# Patient Record
Sex: Male | Born: 1970
Health system: Southern US, Community
[De-identification: ages and names within clinical notes are randomized; demographics above are authoritative.]

## PROBLEM LIST (undated history)

## (undated) DIAGNOSIS — K5792 Diverticulitis of intestine, part unspecified, without perforation or abscess without bleeding: Secondary | ICD-10-CM

## (undated) DIAGNOSIS — K760 Fatty (change of) liver, not elsewhere classified: Secondary | ICD-10-CM

## (undated) DIAGNOSIS — R748 Abnormal levels of other serum enzymes: Secondary | ICD-10-CM

## (undated) HISTORY — DX: Fatty (change of) liver, not elsewhere classified: K76.0

## (undated) HISTORY — PX: WISDOM TOOTH EXTRACTION: SHX21

## (undated) HISTORY — PX: ADENOIDECTOMY: SHX5191

## (undated) HISTORY — DX: Abnormal levels of other serum enzymes: R74.8

## (undated) HISTORY — DX: Diverticulitis of intestine, part unspecified, without perforation or abscess without bleeding: K57.92

---

## 2019-11-18 DIAGNOSIS — R1013 Epigastric pain: Secondary | ICD-10-CM | POA: Diagnosis not present

## 2019-11-19 DIAGNOSIS — R3129 Other microscopic hematuria: Secondary | ICD-10-CM | POA: Diagnosis not present

## 2019-11-19 DIAGNOSIS — Z125 Encounter for screening for malignant neoplasm of prostate: Secondary | ICD-10-CM | POA: Diagnosis not present

## 2019-11-19 DIAGNOSIS — K921 Melena: Secondary | ICD-10-CM | POA: Diagnosis not present

## 2019-11-19 DIAGNOSIS — R1032 Left lower quadrant pain: Secondary | ICD-10-CM | POA: Diagnosis not present

## 2019-11-19 DIAGNOSIS — Z1211 Encounter for screening for malignant neoplasm of colon: Secondary | ICD-10-CM | POA: Diagnosis not present

## 2019-11-29 NOTE — Progress Notes (Addendum)
11/29/2019 Carlos Adams [id number] June 24, 1971  Chief Complaint: Rectal bleeding. Abdominal pain.  HISTORY OF PRESENT ILLNESS: Carlos Adams is a 49 year old male without a significant past medical history. No surgical history. He was referred to our office by Lindaann Pascal PA-C for further evaluation for rectal bleeding. He saw bright red blood on the toilet tissue, on the stool and in the toilet water approximately 3 weeks ago. No associated rectal pain. No constipation or straining. He has seen blood on the tissue sporadically over the past year which he attributed to possibly having hemorrhoids but the episode 3 weeks ago was a larger amount of blood. Two weeks ago,  he developed LLQ abdominal pain which worsened over the next 24 hours. He went to the urgent care and he was prescribed Cipro and Flagyl 500mg  po bid x 10 days for presumed diverticulitis. An abdominal/pelvic CT was ordered but delayed due to insurance authorization. He is scheduled to have the abd/pelvic CT later this week. Labs reported as normal. His LLQ abdominal pain significantly improved 3 to 4 days after starting the antibiotics. He finished his last doses of Cipro and Flagyl today. He denies having any further abdominal pain. No further rectal bleeding. Infrequent NSAID use. He drinks 3 to 4 beers daily ( he did not drink any alcohol while taking Cipro and Flagyl). He smokes marijuana 4 to 5 times monthly. Paternal grandmother with history of colon cancer, he is not sure at what age she was diagnosed. Father with history of diverticulitis.   Current Outpatient Medications on File Prior to Visit  Medication Sig Dispense Refill  . ciprofloxacin (CIPRO) 500 MG tablet Take 500 mg by mouth 2 (two) times daily.     No current facility-administered medications on file prior to visit.   No Known Allergies  REVIEW OF SYSTEMS: Gen: Denies fever, sweats or chills. No weight loss.  CV: Denies chest pain, palpitations or edema. Resp: Denies  cough, shortness of breath of hemoptysis.  GI: See HPI. No GERD sx. GU : Denies urinary burning, blood in urine, increased urinary frequency or incontinence. MS: Denies joint pain, muscles aches or weakness. Derm: Denies rash, itchiness, skin lesions or unhealing ulcers. Psych: Denies depression, anxiety, memory loss, suicidal ideation and confusion. Heme: Denies bruising, bleeding. Neuro:  Denies headaches, dizziness or paresthesias. Endo:  Denies any problems with DM, thyroid or adrenal function.   PHYSICAL EXAM: BP 120/74   Pulse 84   Temp (!) 95.8 F (35.4 C)   Ht 5\' 10"  (1.778 m)   Wt 170 lb (77.1 kg)   BMI 24.39 kg/m  General: Well developed 49 year old male in no acute distress. Head: Normocephalic and atraumatic. Eyes:  Sclerae non-icteric, conjunctive pink. Ears: Normal auditory acuity. Neck: Supple, no lymphadenopathy or thyromegaly.  Lungs: Clear bilaterally to auscultation without wheezes, crackles or rhonchi. Heart: Regular rate and rhythm. No murmur, rub or gallop appreciated.  Abdomen: Soft, nontender, non distended. No masses. No hepatosplenomegaly. Normoactive bowel sounds x 4 quadrants.  Rectal: Deferred, patient to proceed with a colonoscopy for further evaluation. Musculoskeletal: Symmetrical with no gross deformities. Skin: Warm and dry. No rash or lesions on visible extremities. Extremities: No edema. Neurological: Alert oriented x 4, no focal deficits.  Psychological:  Alert and cooperative. Normal mood and affect.  ASSESSMENT AND PLAN:  7. 49 year old male with LLQ pain, presumed to have diverticulitis which resolved after taking Cipro and Flagyl x 10 days. -Colonoscopy benefits and risks  discussed including risk with sedation, risk of bleeding, perforation and infection -Patient to call our office if his rectal bleeding or lower abdominal pain recurs -Request copy of labs from urgent care   2. Family history of colon cancer -See plan # 1  3.  Alcohol over use -Decrease alcohol intake advised    ADDENDUM: Patient developed LLQ abdominal pain. I received a copy of his abd/pelvic CT dated 12/08/2019 which  identified sigmoid diverticulitis with small focus of extraluminal gas image 122 compatible with contained perforation. No abscess. No evidence of bowel obstruction or appendicitis. He was treated with antibiotics for diverticulitis prior to completing the CT. He completed a course of Cipro and Flagyl. His colonoscopy date was rescheduled for 6 weeks post diverticulitis with contained perforation as discussed with Dr. Hilarie Fredrickson. Patient was contacted, no further abdominal pain but had some lower back discomfort. Patient was previously advised to call our office if his lower abdominal pain recurs.    CC:  Long, [place], PA-C

## 2019-12-01 ENCOUNTER — Ambulatory Visit (INDEPENDENT_AMBULATORY_CARE_PROVIDER_SITE_OTHER): Payer: BC Managed Care – PPO | Admitting: Nurse Practitioner

## 2019-12-01 ENCOUNTER — Encounter: Payer: Self-pay | Admitting: Nurse Practitioner

## 2019-12-01 ENCOUNTER — Other Ambulatory Visit: Payer: Self-pay

## 2019-12-01 VITALS — BP 120/74 | HR 84 | Temp 95.8°F | Ht 70.0 in | Wt 170.0 lb

## 2019-12-01 DIAGNOSIS — R1032 Left lower quadrant pain: Secondary | ICD-10-CM | POA: Diagnosis not present

## 2019-12-01 DIAGNOSIS — Z8371 Family history of colonic polyps: Secondary | ICD-10-CM | POA: Diagnosis not present

## 2019-12-01 DIAGNOSIS — K5732 Diverticulitis of large intestine without perforation or abscess without bleeding: Secondary | ICD-10-CM | POA: Diagnosis not present

## 2019-12-01 DIAGNOSIS — K625 Hemorrhage of anus and rectum: Secondary | ICD-10-CM | POA: Diagnosis not present

## 2019-12-01 DIAGNOSIS — Z01818 Encounter for other preprocedural examination: Secondary | ICD-10-CM

## 2019-12-01 MED ORDER — NA SULFATE-K SULFATE-MG SULF 17.5-3.13-1.6 GM/177ML PO SOLN: 1.0000 | Freq: Once | ORAL | 0 refills | Status: AC

## 2019-12-01 NOTE — Patient Instructions (Addendum)
If you are age 51 or older, your body mass index should be between 23-30. Your Body mass index is 24.39 kg/m. If this is out of the aforementioned range listed, please consider follow up with your Primary Care Provider.  If you are age 74 or younger, your body mass index should be between 19-25. Your Body mass index is 24.39 kg/m. If this is out of the aformentioned range listed, please consider follow up with your Primary Care Provider.   You have been scheduled for a colonoscopy. Please follow written instructions given to you at your visit today.  Please pick up your prep supplies at the pharmacy within the next 1-3 days. If you use inhalers (even only as needed), please bring them with you on the day of your procedure.  Due to recent changes in healthcare laws, you Markowicz see the results of your imaging and laboratory studies on MyChart before your provider has had a chance to review them.  We understand that in some cases there Orsino be results that are confusing or concerning to you. Not all laboratory results come back in the same time frame and the provider Chipman be waiting for multiple results in order to interpret others.  Please give [place] 48 hours in order for your provider to thoroughly review all the results before contacting the office for clarification of your results.   Thank you for choosing [facility] Arnaldo Natal, CRNP

## 2019-12-02 NOTE — Progress Notes (Signed)
Addendum: Reviewed and agree with assessment and management plan. Daviel Allegretto M, MD  

## 2019-12-08 DIAGNOSIS — K5720 Diverticulitis of large intestine with perforation and abscess without bleeding: Secondary | ICD-10-CM | POA: Diagnosis not present

## 2020-01-01 ENCOUNTER — Telehealth: Payer: Self-pay | Admitting: Nurse Practitioner

## 2020-01-01 NOTE — Telephone Encounter (Signed)
Bre, please call the patient and obtain a symptom update, verify if he is still having any abdominal pain after he was treated with antibiotic for diverticulitis with a contained perforation. Please inform the patient that due to his recent episode of diverticulitis with a contained perforation per CT 12/08/2019, his colonoscopy will need to be rescheduled for 6 weeks after his CT date as verified by Dr. Rhea Belton. So please reschedule his colonoscopy for after 01/19/2020. Pls let me know symptom update. Thx.

## 2020-01-01 NOTE — Telephone Encounter (Signed)
Please review previous message and advise 

## 2020-01-04 ENCOUNTER — Telehealth: Payer: Self-pay | Admitting: Nurse Practitioner

## 2020-01-04 NOTE — Telephone Encounter (Signed)
See alternative telephone note

## 2020-01-04 NOTE — Telephone Encounter (Signed)
Called and spoke with patient-patient reports he is feeling a little better=LLQ pain has resolved; denies nausea/vomiting/diarrhea/fever/rectal bleeding or pain;  -Is still having back pain/soreness (2/10)-"I am still able to go out and play golf";    Patient had the CT scan on 12/08/2019; patient has been scheduled for his colon on 01/26/2020 at 10:00 am; COVID screening on 01/22/2020 at 9:00 am; instructions have been sent to the patient via MyChart per his request;

## 2020-01-06 NOTE — Telephone Encounter (Signed)
I called the patient just to clarify his back pain.  He stated he golfs a lot and he typically has some lower back soreness.  His left lower quadrant abdominal pain associated with his diverticulitis episode has completely resolved.  I reinforced the importance of avoiding constipation to prevent further diverticulitis episodes.  Drink plenty of water as well.  He agreed to call our office if his lower abdominal pain recurs prior to his colonoscopy date.

## 2020-01-12 ENCOUNTER — Encounter: Payer: BC Managed Care – PPO | Admitting: Internal Medicine

## 2020-01-13 ENCOUNTER — Encounter: Payer: BC Managed Care – PPO | Admitting: Gastroenterology

## 2020-01-19 ENCOUNTER — Telehealth: Payer: Self-pay | Admitting: Nurse Practitioner

## 2020-01-19 DIAGNOSIS — K5732 Diverticulitis of large intestine without perforation or abscess without bleeding: Secondary | ICD-10-CM

## 2020-01-19 MED ORDER — CIPROFLOXACIN HCL 500 MG PO TABS: 500.0000 mg | ORAL_TABLET | Freq: Two times a day (BID) | ORAL | 0 refills | Status: AC

## 2020-01-19 MED ORDER — METRONIDAZOLE 500 MG PO TABS: 500.0000 mg | ORAL_TABLET | Freq: Two times a day (BID) | ORAL | 0 refills | Status: AC

## 2020-01-19 NOTE — Telephone Encounter (Signed)
Called and spoke with patient-pharmacy verified by patient-patient informed of provider's recommendations and is agreeable to plan of care; RX has been sent; patient has been scheduled for a f/u OV on 01/21/2020 at 1:50 pm;  Patient advised to call back to the office at [phone] should questions/concerns arise;  Patient verbalized understanding of information/instructions;

## 2020-01-19 NOTE — Telephone Encounter (Signed)
Bri, pls call patient, he had diverticulitis with a contained perforation 1/20201. He needs office visit tomorrow for further evaluation. Pls send in RX for cipro 500mg  1 po bid and Flagyl 500mg  1 po bid x 10 days, pt to start today. If he has severe pain then to ER. diet, low fiber diet today and push fluids. He is scheduled for a colonoscopy 3/23 which Rathod need to be rescheduled, to determined further after he is seen in office

## 2020-01-19 NOTE — Telephone Encounter (Signed)
Pt believes that he is having a diverticulitis flare.  Please advise.

## 2020-01-19 NOTE — Telephone Encounter (Signed)
Noted  

## 2020-01-19 NOTE — Telephone Encounter (Signed)
Called and spoke with patient- patient reports he started having a tender/pain in the LLQ abd this morning-pain on palpatation (2/10)-no nausea/constipation/diarrhea/fever/rectal bleeding/rectal pain-no problems with po intake;-patient reports this Dubree be diet related-has not been following a "sensible diet" -took Miralax this morning just to see if that might help with abd tenderness Please advise on next step in care

## 2020-01-21 ENCOUNTER — Other Ambulatory Visit (INDEPENDENT_AMBULATORY_CARE_PROVIDER_SITE_OTHER): Payer: BC Managed Care – PPO

## 2020-01-21 ENCOUNTER — Other Ambulatory Visit: Payer: Self-pay

## 2020-01-21 ENCOUNTER — Ambulatory Visit (INDEPENDENT_AMBULATORY_CARE_PROVIDER_SITE_OTHER): Payer: BC Managed Care – PPO | Admitting: Nurse Practitioner

## 2020-01-21 ENCOUNTER — Encounter: Payer: Self-pay | Admitting: Nurse Practitioner

## 2020-01-21 VITALS — BP 140/76 | HR 80 | Temp 98.3°F | Ht 70.0 in | Wt 171.0 lb

## 2020-01-21 DIAGNOSIS — K5720 Diverticulitis of large intestine with perforation and abscess without bleeding: Secondary | ICD-10-CM

## 2020-01-21 DIAGNOSIS — K5732 Diverticulitis of large intestine without perforation or abscess without bleeding: Secondary | ICD-10-CM | POA: Diagnosis not present

## 2020-01-21 DIAGNOSIS — R1032 Left lower quadrant pain: Secondary | ICD-10-CM

## 2020-01-21 LAB — CBC WITH DIFFERENTIAL/PLATELET
Basophils Absolute: 0.1 10*3/uL (ref 0.0–0.1)
Basophils Relative: 0.9 % (ref 0.0–3.0)
Eosinophils Absolute: 0.2 10*3/uL (ref 0.0–0.7)
Eosinophils Relative: 4.1 % (ref 0.0–5.0)
HCT: 42.4 % (ref 39.0–52.0)
Hemoglobin: 14.8 g/dL (ref 13.0–17.0)
Lymphocytes Relative: 13.1 % (ref 12.0–46.0)
Lymphs Abs: 0.7 10*3/uL (ref 0.7–4.0)
MCHC: 34.8 g/dL (ref 30.0–36.0)
MCV: 98.8 fl (ref 78.0–100.0)
Monocytes Absolute: 0.7 10*3/uL (ref 0.1–1.0)
Monocytes Relative: 12.6 % — ABNORMAL HIGH (ref 3.0–12.0)
Neutro Abs: 3.8 10*3/uL (ref 1.4–7.7)
Neutrophils Relative %: 69.3 % (ref 43.0–77.0)
Platelets: 265 10*3/uL (ref 150.0–400.0)
RBC: 4.29 Mil/uL (ref 4.22–5.81)
RDW: 12.8 % (ref 11.5–15.5)
WBC: 5.5 10*3/uL (ref 4.0–10.5)

## 2020-01-21 LAB — COMPREHENSIVE METABOLIC PANEL
ALT: 39 U/L (ref 0–53)
AST: 55 U/L — ABNORMAL HIGH (ref 0–37)
Albumin: 4.5 g/dL (ref 3.5–5.2)
Alkaline Phosphatase: 69 U/L (ref 39–117)
BUN: 13 mg/dL (ref 6–23)
CO2: 28 mEq/L (ref 19–32)
Calcium: 10.2 mg/dL (ref 8.4–10.5)
Chloride: 98 mEq/L (ref 96–112)
Creatinine, Ser: 1.01 mg/dL (ref 0.40–1.50)
GFR: 77.98 mL/min (ref >60.00)
Glucose, Bld: 124 mg/dL — ABNORMAL HIGH (ref 70–99)
Potassium: 3.8 mEq/L (ref 3.5–5.1)
Sodium: 134 mEq/L — ABNORMAL LOW (ref 135–145)
Total Bilirubin: 0.5 mg/dL (ref 0.2–1.2)
Total Protein: 7.7 g/dL (ref 6.0–8.3)

## 2020-01-21 LAB — C-REACTIVE PROTEIN: CRP: 2 mg/dL (ref 0.5–20.0)

## 2020-01-21 NOTE — Progress Notes (Signed)
01/21/2020 Carlos Adams [id number] 1971/08/08   Chief Complaint: Diverticulitis   History of Present Illness: Carlos Adams is a 49 year old male without a significant past medical history. No surgical history. I saw him in the office on 12/01/2019 after he was treated with Cipro and Flagyl 500mg  po bid x 10 days for diverticulitis by his PCP Carlos Long PA-C . An abdominal/pelvic CT was delayed due to insurance issues but was done on  12/08/2019 which identified sigmoid diverticulitis with a small contained perforation without evidence of an abscess. His LLQ abdominal pain abated without requiring further antibiotics. A colonoscopy was rescheduled for 01/26/2020. He contacted our office on 01/19/2020 with complaints of mild LLQ abdominal pain which started upon awakening on 01/18/2020.  He denied any associated constipation.  He blames his dietary habits for his diverticulitis.  He eats fast foods and a low fiber diet on a regular basis.  He was started on Cipro and Flagyl 500mg  po bid and he was advised to follow up in the office for further evaluation as his colonoscopy [place] need to be delayed. He presents today for further evaluation.  His left lower quadrant abdominal pain has decreased since starting the Cipro and Flagyl 2 days ago.  No fever.  He is passing normal brown bowel movement daily.    Abdominal/pelvic CT with contrast 12/08/2019 [facility]: CT ABDOMEN: Lung bases clear and heart size normal. Low-attenuation liver suggesting fatty infiltration. Normal gallbladder, biliary tree, pancreas, spleen, adrenal glands, and kidneys. Aortoiliac atherosclerosis without aneurysm. No ascites or adenopathy.  CT PELVIS: Normal bladder, prostate, and seminal vesicles. Sigmoid diverticulitis image 129 with small focus of extraluminal gas image 122 compatible with contained perforation. No abscess. No evidence for bowel obstruction or appendicitis. DDD L2-L3.  IMPRESSION: 1. Perforated sigmoid diverticulitis.  No abscess. 2. Fatty liver. 3. Aortoiliac atherosclerosis without aneurysm.   Current Medications, Allergies, Past Medical History, Past Surgical History, Family History and Social History were reviewed in [employer] record.   Physical Exam: BP 140/76   Pulse 80   Temp 98.3 F (36.8 C)   Ht 5\' 10"  (1.778 m)   Wt 171 lb (77.6 kg)   BMI 24.54 kg/m   General: Well developed male in no acute distress. Head: Normocephalic and atraumatic. Eyes:  No scleral icterus. Conjunctiva pink . Ears: Normal auditory acuity. Lungs: Clear throughout to auscultation. Heart: Regular rate and rhythm, no murmur. Abdomen: Soft, thickened area central lower abdomen left of midline with associated tenderness.  No rebound or guarding.  No masses or hepatomegaly. Normal bowel sounds x 4 quadrants.  Rectal: Deferred.  Musculoskeletal: Symmetrical with no gross deformities. Extremities: No edema. Neurological: Alert oriented x 4. No focal deficits.  Psychological:  Alert and cooperative. Normal mood and affect  Assessment and Recommendations:  1. Carlos Adams is a 49 year old male diagnosed with sigmoid diverticulitis 11/2019 treated with Cipro and Flagyl 500 mg twice daily for 10 days.  An abdominal/pelvic CT was delayed due to insurance issues and was done on 12/08/2019 which identified  sigmoid diverticulitis with a small contained perforation.  -Cancel colonoscopy scheduled 01/26/2020.  Reschedule colonoscopy after repeat abd/pelvic CT results reviewed.  -Continue Cipro and Flagyl 500mg  po bid x 10 days  -CBC, CMP and CRP -Repeat abd/pelvic CT with oral and IV  Contrast -Patient to call our office if his abdominal pain persists or worsens.  To the ED if he develops severe abdominal pain. -Low fiber diet  for 24 hours then advance diet -Miralax as needed -Phillip's bacteria probiotic once daily  -Discussed surgical consult after colonoscopy completed   2. Fatty liver  -CMP -follow  up after colonoscopy completed

## 2020-01-21 NOTE — Patient Instructions (Signed)
If you are age 51 or older, your body mass index should be between 23-30. Your Body mass index is 24.54 kg/m. If this is out of the aforementioned range listed, please consider follow up with your Primary Care Provider.  If you are age 74 or younger, your body mass index should be between 19-25. Your Body mass index is 24.54 kg/m. If this is out of the aformentioned range listed, please consider follow up with your Primary Care Provider.   You have been scheduled for a CT scan of the abdomen and pelvis at [facility] ([address]---this is in the same building as [facility]).   You are scheduled on 01/28/2020 at 10:00am. You should arrive 15 minutes prior to your appointment time for registration. Please follow the written instructions below on the day of your exam:  WARNING: IF YOU ARE ALLERGIC TO IODINE/X-RAY DYE, PLEASE NOTIFY RADIOLOGY IMMEDIATELY AT [phone]! YOU WILL BE GIVEN A 13 HOUR PREMEDICATION PREP.  1) Do not eat or drink anything after 6:00 am (4 hours prior to your test) 2) You have been given 2 bottles of oral contrast to drink. The solution Dosch taste better if refrigerated, but do NOT add ice or any other liquid to this solution. Shake well before drinking.    Drink 1 bottle of contrast @ 8:00 am (2 hours prior to your exam)  Drink 1 bottle of contrast @ 9:00 am (1 hour prior to your exam)  You Defilippo take any medications as prescribed with a small amount of water, if necessary. If you take any of the following medications: METFORMIN, GLUCOPHAGE, GLUCOVANCE, AVANDAMET, RIOMET, FORTAMET, [place] MET, JANUMET, GLUMETZA or METAGLIP, you Nunziata be asked to HOLD this medication 48 hours AFTER the exam.  The purpose of you drinking the oral contrast is to aid in the visualization of your intestinal tract. The contrast solution Weiss cause some diarrhea. Depending on your individual set of symptoms, you Schnake also receive an intravenous injection of x-ray  contrast/dye. Plan on being at [facility] for 30 minutes or longer, depending on the type of exam you are having performed.  This test typically takes 30-45 minutes to complete.  If you have any questions regarding your exam or if you need to reschedule, you Decamp call the CT department at [phone] between the hours of 8:00 am and 5:00 pm, Monday-Friday.  ________________________________________________________________________  Your provider has requested that you go to the basement level for lab work before leaving today. Press "B" on the elevator. The lab is located at the first door on the left as you exit the elevator.  Continue your Cipro and Flagyl Use Phillips bacteria 1 tablet daily for 2 weeks.  Go to the ED if your symptoms worsen.  Due to recent changes in healthcare laws, you Ziomek see the results of your imaging and laboratory studies on MyChart before your provider has had a chance to review them.  We understand that in some cases there Mell be results that are confusing or concerning to you. Not all laboratory results come back in the same time frame and the provider Jillson be waiting for multiple results in order to interpret others.  Please give [place] 48 hours in order for your provider to thoroughly review all the results before contacting the office for clarification of your results.

## 2020-01-23 NOTE — Progress Notes (Signed)
Addendum: Reviewed and agree with assessment and management plan. Eino Whitner M, MD  

## 2020-01-25 ENCOUNTER — Other Ambulatory Visit: Payer: Self-pay

## 2020-01-25 DIAGNOSIS — R7401 Elevation of levels of liver transaminase levels: Secondary | ICD-10-CM

## 2020-01-26 ENCOUNTER — Encounter: Payer: BC Managed Care – PPO | Admitting: Gastroenterology

## 2020-01-28 ENCOUNTER — Ambulatory Visit (INDEPENDENT_AMBULATORY_CARE_PROVIDER_SITE_OTHER)
Admission: RE | Admit: 2020-01-28 | Discharge: 2020-01-28 | Disposition: A | Discharge status: Home / Self Care | Payer: BC Managed Care – PPO | Source: Ambulatory Visit | Attending: Nurse Practitioner | Admitting: Nurse Practitioner

## 2020-01-28 ENCOUNTER — Other Ambulatory Visit: Payer: Self-pay

## 2020-01-28 DIAGNOSIS — K5732 Diverticulitis of large intestine without perforation or abscess without bleeding: Secondary | ICD-10-CM

## 2020-01-28 DIAGNOSIS — R1032 Left lower quadrant pain: Secondary | ICD-10-CM | POA: Diagnosis not present

## 2020-01-28 DIAGNOSIS — R109 Unspecified abdominal pain: Secondary | ICD-10-CM | POA: Diagnosis not present

## 2020-01-28 MED ORDER — IOHEXOL 300 MG/ML  SOLN
100.0000 mL | Freq: Once | INTRAMUSCULAR | Status: AC | PRN
  Administered 2020-01-28: 100 mL via INTRAVENOUS

## 2020-02-04 ENCOUNTER — Other Ambulatory Visit (INDEPENDENT_AMBULATORY_CARE_PROVIDER_SITE_OTHER): Payer: BC Managed Care – PPO

## 2020-02-04 DIAGNOSIS — R7401 Elevation of levels of liver transaminase levels: Secondary | ICD-10-CM | POA: Diagnosis not present

## 2020-02-04 LAB — HEPATIC FUNCTION PANEL
ALT: 40 U/L (ref 0–53)
AST: 29 U/L (ref 0–37)
Albumin: 4.8 g/dL (ref 3.5–5.2)
Alkaline Phosphatase: 55 U/L (ref 39–117)
Bilirubin, Direct: 0.1 mg/dL (ref 0.0–0.3)
Total Bilirubin: 0.7 mg/dL (ref 0.2–1.2)
Total Protein: 7.6 g/dL (ref 6.0–8.3)

## 2020-02-15 ENCOUNTER — Other Ambulatory Visit: Payer: Self-pay

## 2020-02-15 ENCOUNTER — Ambulatory Visit (AMBULATORY_SURGERY_CENTER): Payer: Self-pay | Admitting: *Deleted

## 2020-02-15 VITALS — Temp 98.1°F | Ht 70.0 in | Wt 167.6 lb

## 2020-02-15 DIAGNOSIS — Z1211 Encounter for screening for malignant neoplasm of colon: Secondary | ICD-10-CM

## 2020-02-15 DIAGNOSIS — K5732 Diverticulitis of large intestine without perforation or abscess without bleeding: Secondary | ICD-10-CM

## 2020-02-15 NOTE — Progress Notes (Signed)
Patient denies any allergies to egg or soy products. Patient denies complications with anesthesia/sedation.  Patient denies oxygen use at home and denies diet medications. Emmi instructions for colonoscopy explained and given to patient.  

## 2020-02-22 ENCOUNTER — Ambulatory Visit (INDEPENDENT_AMBULATORY_CARE_PROVIDER_SITE_OTHER): Payer: BC Managed Care – PPO

## 2020-02-22 ENCOUNTER — Other Ambulatory Visit: Payer: Self-pay | Admitting: Gastroenterology

## 2020-02-22 DIAGNOSIS — Z1159 Encounter for screening for other viral diseases: Secondary | ICD-10-CM | POA: Diagnosis not present

## 2020-02-22 LAB — SARS CORONAVIRUS 2 (TAT 6-24 HRS): SARS Coronavirus 2: NEGATIVE

## 2020-02-23 ENCOUNTER — Encounter: Payer: Self-pay | Admitting: Gastroenterology

## 2020-02-24 ENCOUNTER — Other Ambulatory Visit: Payer: Self-pay

## 2020-02-24 ENCOUNTER — Encounter: Payer: Self-pay | Admitting: Gastroenterology

## 2020-02-24 ENCOUNTER — Ambulatory Visit (AMBULATORY_SURGERY_CENTER): Payer: BC Managed Care – PPO | Admitting: Gastroenterology

## 2020-02-24 VITALS — BP 123/75 | HR 60 | Temp 96.8°F | Resp 15 | Ht 70.0 in | Wt 167.0 lb

## 2020-02-24 DIAGNOSIS — Z1211 Encounter for screening for malignant neoplasm of colon: Secondary | ICD-10-CM

## 2020-02-24 MED ORDER — SODIUM CHLORIDE 0.9 % IV SOLN: 500.0000 mL | Freq: Once | INTRAVENOUS | Status: DC

## 2020-02-24 NOTE — Progress Notes (Signed)
Pt's states no medical or surgical changes since previsit or office visit.  LC - temp DT - vitals 

## 2020-02-24 NOTE — Patient Instructions (Signed)
YOU HAD AN ENDOSCOPIC PROCEDURE TODAY AT THE [facility]:   Refer to the procedure report that was given to you for any specific questions about what was found during the examination.  If the procedure report does not answer your questions, please call your gastroenterologist to clarify.  If you requested that your care partner not be given the details of your procedure findings, then the procedure report has been included in a sealed envelope for you to review at your convenience later. ? ?**Handout given on Diverticulosis** ? ?YOU SHOULD EXPECT: Some feelings of bloating in the abdomen. Passage of more gas than usual.  Walking can help get rid of the air that was put into your GI tract during the procedure and reduce the bloating. If you had a lower endoscopy (such as a colonoscopy or flexible sigmoidoscopy) you Fristoe notice spotting of blood in your stool or on the toilet paper. If you underwent a bowel prep for your procedure, you Depp not have a normal bowel movement for a few days. ? ?Please Note:  You might notice some irritation and congestion in your nose or some drainage.  This is from the oxygen used during your procedure.  There is no need for concern and it should clear up in a day or so. ? ?SYMPTOMS TO REPORT IMMEDIATELY: ? ?Following lower endoscopy (colonoscopy or flexible sigmoidoscopy): ? Excessive amounts of blood in the stool ? Significant tenderness or worsening of abdominal pains ? Swelling of the abdomen that is new, acute ? Fever of 100?F or higher ? ? ?For urgent or emergent issues, a gastroenterologist can be reached at any hour by calling [phone]. ?Do not use MyChart messaging for urgent concerns.  ? ? ?DIET:  We do recommend a small meal at first, but then you Laverdiere proceed to your regular diet.  Drink plenty of fluids but you should avoid alcoholic beverages for 24 hours. ? ?ACTIVITY:  You should plan to take it easy for the rest of today and you should NOT DRIVE or use  heavy machinery until tomorrow (because of the sedation medicines used during the test).   ? ?FOLLOW UP: ?Our staff will call the number listed on your records 48-72 hours following your procedure to check on you and address any questions or concerns that you Frady have regarding the information given to you following your procedure. If we do not reach you, we will leave a message.  We will attempt to reach you two times.  During this call, we will ask if you have developed any symptoms of COVID 19. If you develop any symptoms (ie: fever, flu-like symptoms, shortness of breath, cough etc.) before then, please call [phone].  If you test positive for Covid 19 in the 2 weeks post procedure, please call and report this information to us.   ? ?If any biopsies were taken you will be contacted by phone or by letter within the next 1-3 weeks.  Please call us at [phone] if you have not heard about the biopsies in 3 weeks.  ? ? ?SIGNATURES/CONFIDENTIALITY: ?You and/or your care partner have signed paperwork which will be entered into your electronic medical record.  These signatures attest to the fact that that the information above on your After Visit Summary has been reviewed and is understood.  Full responsibility of the confidentiality of this discharge information lies with you and/or your care-partner.  ?

## 2020-02-24 NOTE — Progress Notes (Signed)
A and O x3. Report to RN. Tolerated MAC anesthesia well.

## 2020-02-24 NOTE — Op Note (Signed)
[place] Patient Name: Carlos Adams Procedure Date: 02/24/2020 10:05 AM MRN: [id number] Endoscopist: Mallie Mussel L. Loletha Carrow , MD Age: 49 Referring MD:  Date of Birth: 1971-10-13 Gender: Male Account #: [id number] Procedure:                Colonoscopy Indications:              Screening for colorectal malignant neoplasm, This                            is the patient's first colonoscopy                           (Patient has recently had diverticulitis) Medicines:                Monitored Anesthesia Care Procedure:                Pre-Anesthesia Assessment:                           - Prior to the procedure, a History and Physical                            was performed, and patient medications and                            allergies were reviewed. The patient's tolerance of                            previous anesthesia was also reviewed. The risks                            and benefits of the procedure and the sedation                            options and risks were discussed with the patient.                            All questions were answered, and informed consent                            was obtained. Prior Anticoagulants: The patient has                            taken no previous anticoagulant or antiplatelet                            agents. ASA Grade Assessment: II - A patient with                            mild systemic disease. After reviewing the risks                            and benefits, the patient was deemed in  satisfactory condition to undergo the procedure.                           After obtaining informed consent, the colonoscope                            was passed under direct vision. Throughout the                            procedure, the patient's blood pressure, pulse, and                            oxygen saturations were monitored continuously. The                            Colonoscope was introduced through the  anus and                            advanced to the the cecum, identified by                            appendiceal orifice and ileocecal valve. The                            colonoscopy was performed without difficulty. The                            patient tolerated the procedure well. The quality                            of the bowel preparation was excellent. The                            ileocecal valve, appendiceal orifice, and rectum                            were photographed. The bowel preparation used was                            Miralax. Scope In: 10:09:40 AM Scope Out: 10:24:57 AM Scope Withdrawal Time: 0 hours 14 minutes 3 seconds  Total Procedure Duration: 0 hours 15 minutes 17 seconds  Findings:                 The perianal and digital rectal examinations were                            normal.                           Multiple small-mouthed diverticula were found in                            the left colon.  The exam was otherwise without abnormality on                            direct and retroflexion views. Complications:            No immediate complications. Estimated Blood Loss:     Estimated blood loss: none. Impression:               - Diverticulosis in the left colon.                           - The examination was otherwise normal on direct                            and retroflexion views.                           - No specimens collected. Recommendation:           - Patient has a contact number available for                            emergencies. The signs and symptoms of potential                            delayed complications were discussed with the                            patient. Return to normal activities tomorrow.                            Written discharge instructions were provided to the                            patient.                           - Resume previous diet.                           -  Continue present medications.                           - Repeat colonoscopy in 10 years for screening                            purposes. Cleopatra Sardo L. Myrtie Neither, MD 02/24/2020 10:27:32 AM This report has been signed electronically.

## 2020-02-26 ENCOUNTER — Telehealth: Payer: Self-pay | Admitting: *Deleted

## 2020-02-26 NOTE — Telephone Encounter (Signed)
  Follow up Call-  Call back number 02/24/2020  Post procedure Call Back phone  # [phone]  Permission to leave phone message Yes     Patient questions:  Do you have a fever, pain , or abdominal swelling? No. Pain Score  0 *  Have you tolerated food without any problems? Yes.    Have you been able to return to your normal activities? Yes.    Do you have any questions about your discharge instructions: Diet   No. Medications  No. Follow up visit  No.  Do you have questions or concerns about your Care? No.  Actions: * If pain score is 4 or above: No action needed, pain <4.  1. Have you developed a fever since your procedure? no  2.   Have you had an respiratory symptoms (SOB or cough) since your procedure? no  3.   Have you tested positive for COVID 19 since your procedure no  4.   Have you had any family members/close contacts diagnosed with the COVID 19 since your procedure?  no   If yes to any of these questions please route to Laverna Peace, RN and Charlett Lango, RN

## 2020-02-26 NOTE — Telephone Encounter (Signed)
Left message on f/u call 

## 2021-03-23 IMAGING — CT CT ABD-PELV W/ CM
2 of 5 series · 16 of 46 positions shown, 18 images · IV contrast (OMNIPAQUE 300)
Comparison: Report from [REDACTED] CT abdomen/pelvis from
12/08/2019

CLINICAL DATA: Left lower quadrant abdominal pain. Previous sigmoid
diverticulitis

EXAM:
CT ABDOMEN AND PELVIS WITH CONTRAST
TECHNIQUE: Multidetector CT imaging of the abdomen and pelvis was performed
using the standard protocol following bolus administration of
intravenous contrast.
CONTRAST:  100mL OMNIPAQUE IOHEXOL 300 MG/ML  SOLN

[Series 2: abd/pel w · axial · 0.68mm/px · z∈[-531,-131]mm · 13 of 90 slices shown, 15 images]
[im 5/90  soft-tissue]
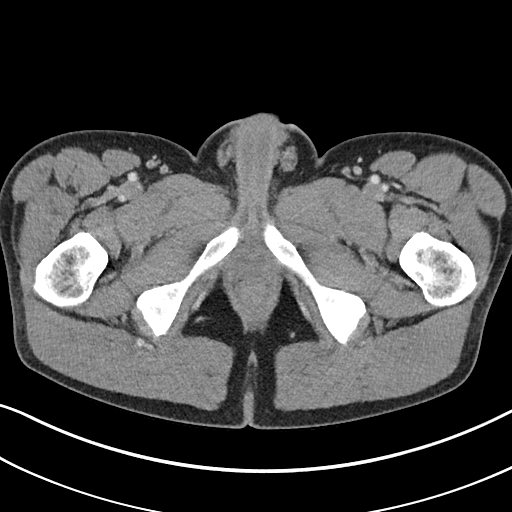
[im 5/90  bone]
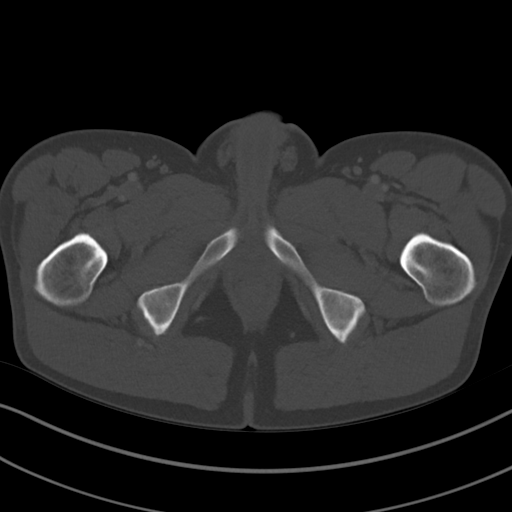
[im 15/90  soft-tissue]
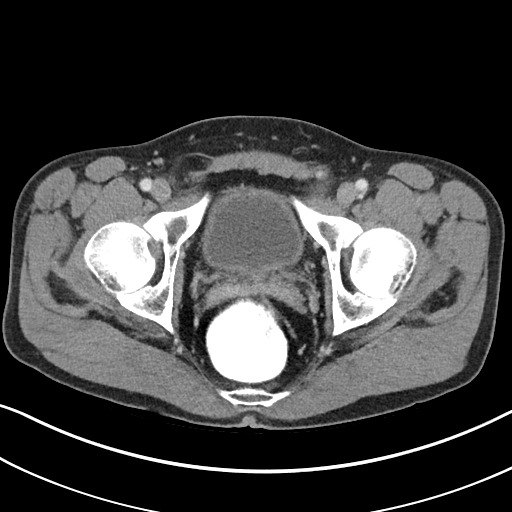
[im 19/90  soft-tissue]
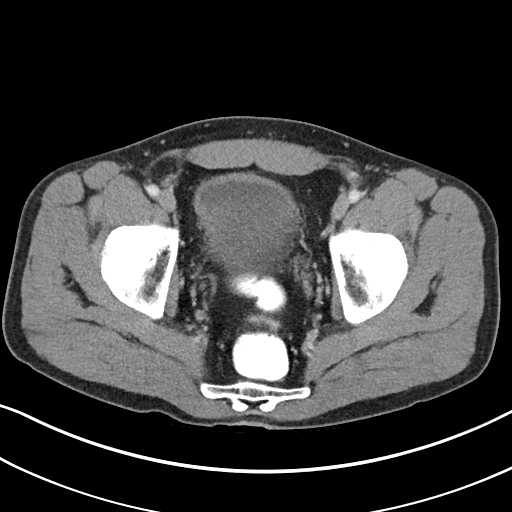
[im 24/90  soft-tissue]
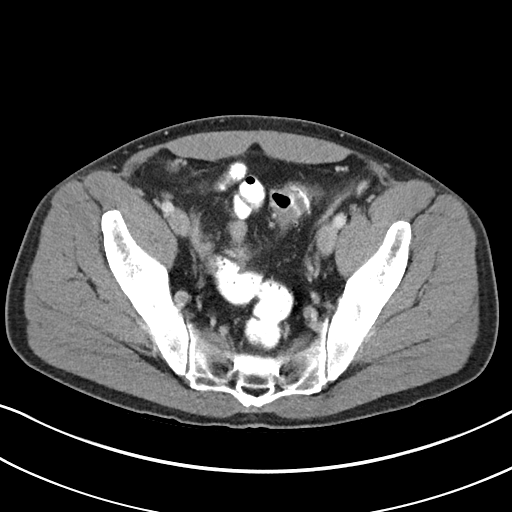
[im 33/90  soft-tissue]
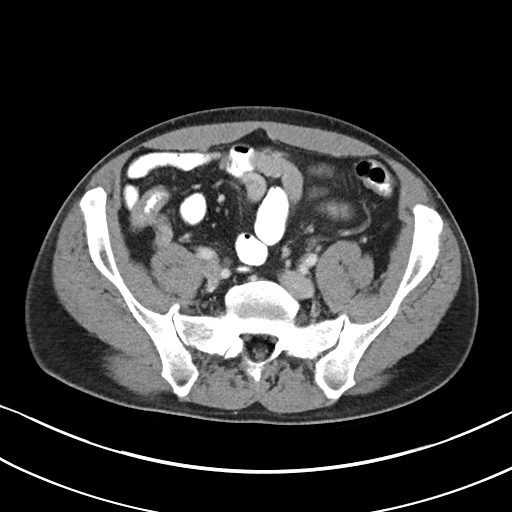
[im 38/90  soft-tissue]
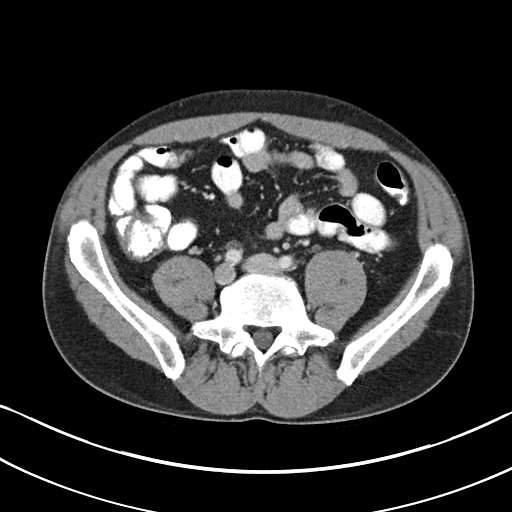
[im 47/90  soft-tissue]
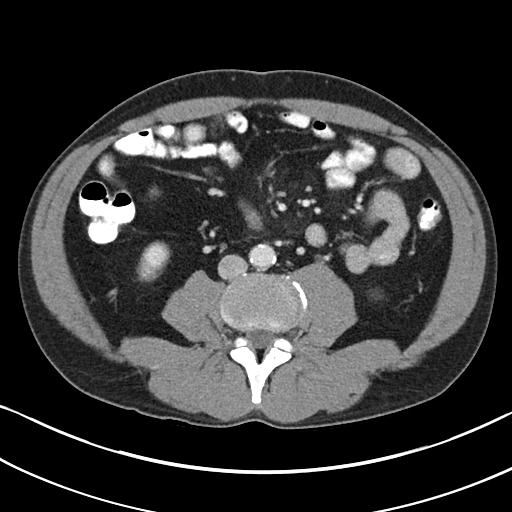
[im 52/90  soft-tissue]
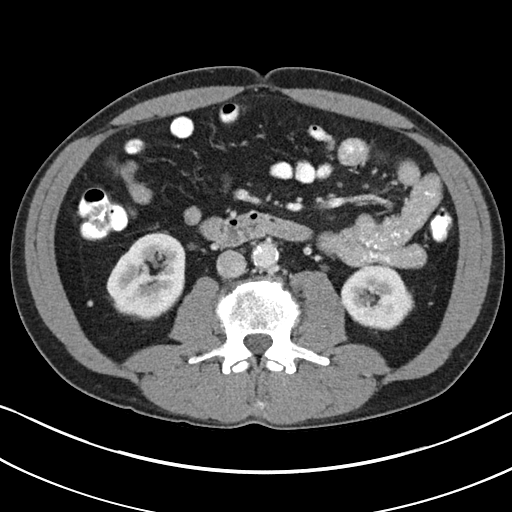
[im 57/90  soft-tissue]
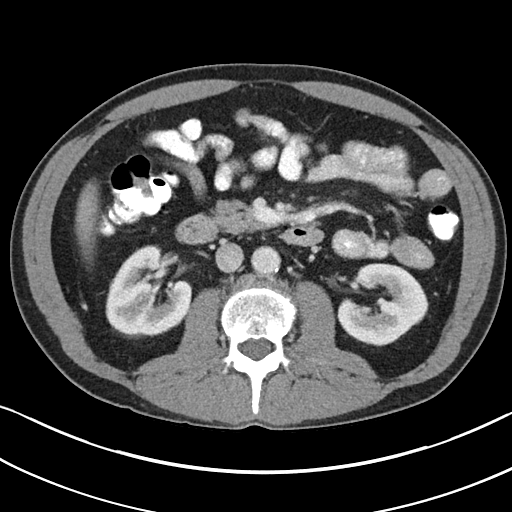
[im 57/90  bone]
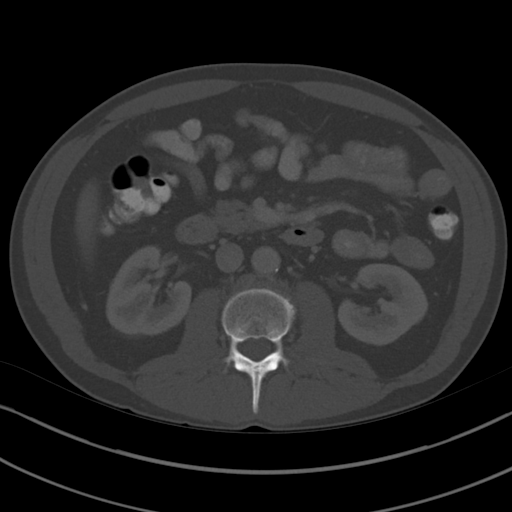
[im 66/90  soft-tissue]
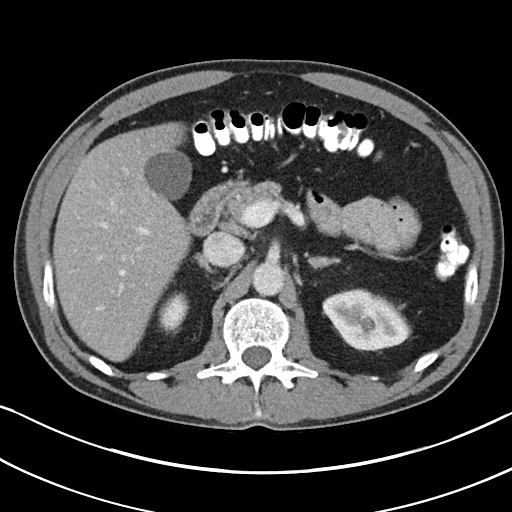
[im 71/90  soft-tissue]
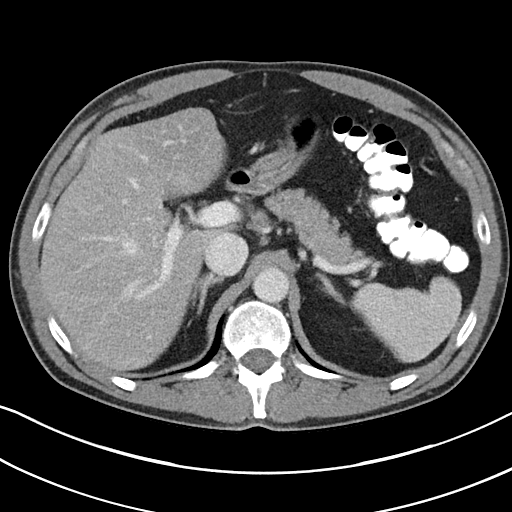
[im 75/90  soft-tissue]
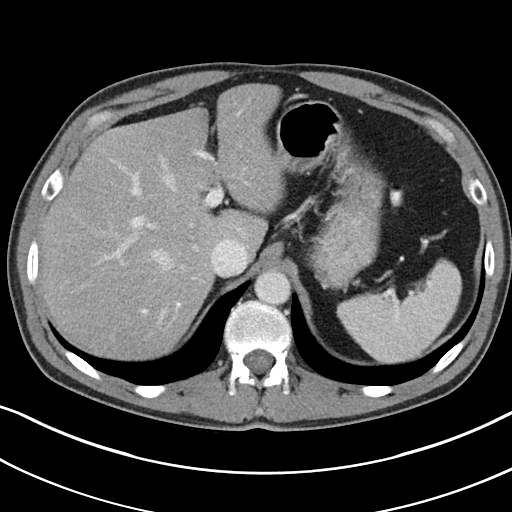
[im 85/90  soft-tissue]
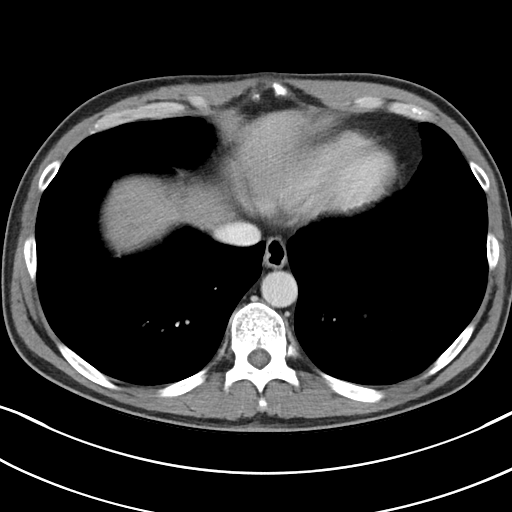

[Series 5: abd/pel w st · coronal · 0.70mm/px · 3 of 82 slices shown]
[im 28/82  soft-tissue]
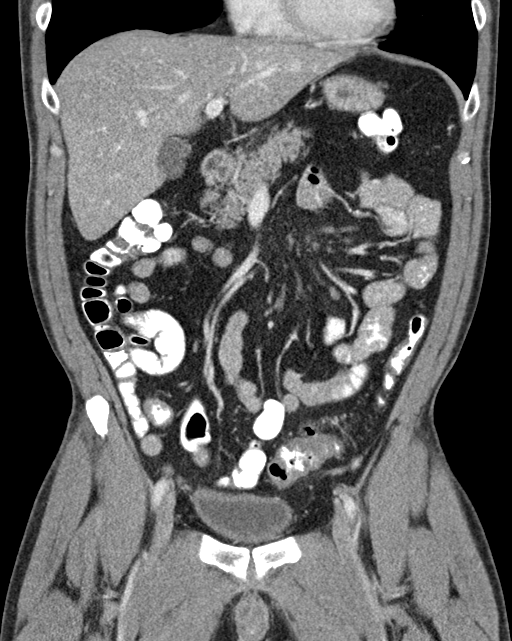
[im 37/82  soft-tissue]
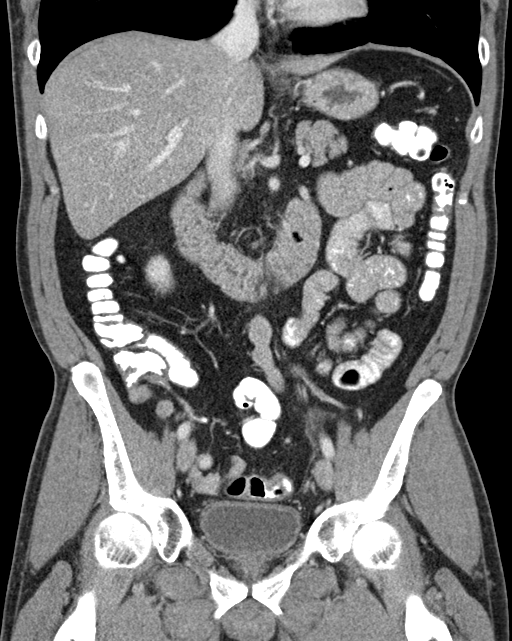
[im 46/82  soft-tissue]
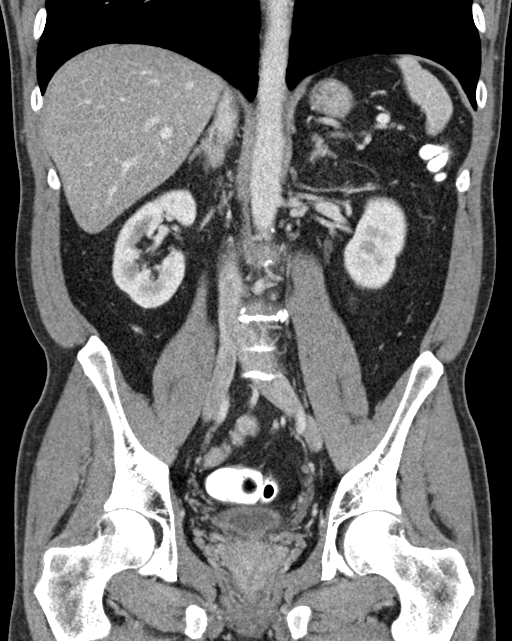

[16 of 46 positions shown; findings below may reference images not displayed]

FINDINGS: Lower chest: Unremarkable

Hepatobiliary: Unremarkable

Pancreas: Unremarkable

Spleen: Unremarkable

Adrenals/Urinary Tract: Unremarkable

Stomach/Bowel: Sigmoid colon diverticulosis and acute
diverticulitis, with a linear 2.6 by 0.4 by 0.4 cm (volume =
cm^3) collection of extraluminal gas posterior to the proximal
sigmoid colon on image 62/2. The appearance suggests localized micro
perforation. This gas is contained locally and no other significant
extraluminal gas is observed.

Normal appendix.

Vascular/Lymphatic: Aortoiliac atherosclerotic vascular disease.

Reproductive: Unremarkable

Other: No supplemental non-categorized findings.

Musculoskeletal: Mild right foraminal impingement at L4-5 due to
spurring and disc protrusion.
IMPRESSION: 1. Acute sigmoid colon diverticulitis with a small linear contained
collection of extraluminal gas measuring 0.2 cubic cm posterior to
the proximal sigmoid colon, compatible with localized micro
perforation. No other significant extraluminal gas.
2. Mild right foraminal impingement at L4-5 due to spurring and disc
protrusion.

Aortic Atherosclerosis ([id number]).

## 2021-08-22 DIAGNOSIS — H3561 Retinal hemorrhage, right eye: Secondary | ICD-10-CM | POA: Diagnosis not present

## 2021-11-13 DIAGNOSIS — Z20822 Contact with and (suspected) exposure to covid-19: Secondary | ICD-10-CM | POA: Diagnosis not present

## 2022-12-06 DIAGNOSIS — Z419 Encounter for procedure for purposes other than remedying health state, unspecified: Secondary | ICD-10-CM | POA: Diagnosis not present

## 2023-01-04 DIAGNOSIS — Z419 Encounter for procedure for purposes other than remedying health state, unspecified: Secondary | ICD-10-CM | POA: Diagnosis not present

## 2023-02-04 DIAGNOSIS — Z419 Encounter for procedure for purposes other than remedying health state, unspecified: Secondary | ICD-10-CM | POA: Diagnosis not present

## 2023-03-06 DIAGNOSIS — Z419 Encounter for procedure for purposes other than remedying health state, unspecified: Secondary | ICD-10-CM | POA: Diagnosis not present

## 2023-04-06 DIAGNOSIS — Z419 Encounter for procedure for purposes other than remedying health state, unspecified: Secondary | ICD-10-CM | POA: Diagnosis not present

## 2023-05-06 DIAGNOSIS — Z419 Encounter for procedure for purposes other than remedying health state, unspecified: Secondary | ICD-10-CM | POA: Diagnosis not present

## 2023-06-06 DIAGNOSIS — Z419 Encounter for procedure for purposes other than remedying health state, unspecified: Secondary | ICD-10-CM | POA: Diagnosis not present

## 2023-07-07 DIAGNOSIS — Z419 Encounter for procedure for purposes other than remedying health state, unspecified: Secondary | ICD-10-CM | POA: Diagnosis not present

## 2023-08-06 DIAGNOSIS — Z419 Encounter for procedure for purposes other than remedying health state, unspecified: Secondary | ICD-10-CM | POA: Diagnosis not present

## 2023-08-16 DIAGNOSIS — M542 Cervicalgia: Secondary | ICD-10-CM | POA: Diagnosis not present

## 2023-08-29 DIAGNOSIS — L570 Actinic keratosis: Secondary | ICD-10-CM | POA: Diagnosis not present

## 2023-08-29 DIAGNOSIS — Z86018 Personal history of other benign neoplasm: Secondary | ICD-10-CM | POA: Diagnosis not present

## 2023-08-29 DIAGNOSIS — D1722 Benign lipomatous neoplasm of skin and subcutaneous tissue of left arm: Secondary | ICD-10-CM | POA: Diagnosis not present

## 2023-09-06 DIAGNOSIS — Z419 Encounter for procedure for purposes other than remedying health state, unspecified: Secondary | ICD-10-CM | POA: Diagnosis not present

## 2023-10-06 DIAGNOSIS — Z419 Encounter for procedure for purposes other than remedying health state, unspecified: Secondary | ICD-10-CM | POA: Diagnosis not present

## 2023-11-06 DIAGNOSIS — Z419 Encounter for procedure for purposes other than remedying health state, unspecified: Secondary | ICD-10-CM | POA: Diagnosis not present

## 2023-12-07 DIAGNOSIS — Z419 Encounter for procedure for purposes other than remedying health state, unspecified: Secondary | ICD-10-CM | POA: Diagnosis not present

## 2024-01-04 DIAGNOSIS — Z419 Encounter for procedure for purposes other than remedying health state, unspecified: Secondary | ICD-10-CM | POA: Diagnosis not present

## 2024-01-07 DIAGNOSIS — Z1331 Encounter for screening for depression: Secondary | ICD-10-CM | POA: Diagnosis not present

## 2024-01-07 DIAGNOSIS — Z0000 Encounter for general adult medical examination without abnormal findings: Secondary | ICD-10-CM | POA: Diagnosis not present

## 2024-01-07 DIAGNOSIS — Z1339 Encounter for screening examination for other mental health and behavioral disorders: Secondary | ICD-10-CM | POA: Diagnosis not present

## 2024-01-07 DIAGNOSIS — Z23 Encounter for immunization: Secondary | ICD-10-CM | POA: Diagnosis not present

## 2024-01-09 DIAGNOSIS — Z0000 Encounter for general adult medical examination without abnormal findings: Secondary | ICD-10-CM | POA: Diagnosis not present

## 2024-01-09 DIAGNOSIS — Z125 Encounter for screening for malignant neoplasm of prostate: Secondary | ICD-10-CM | POA: Diagnosis not present

## 2024-01-09 DIAGNOSIS — D3131 Benign neoplasm of right choroid: Secondary | ICD-10-CM | POA: Diagnosis not present

## 2024-02-15 DIAGNOSIS — Z419 Encounter for procedure for purposes other than remedying health state, unspecified: Secondary | ICD-10-CM | POA: Diagnosis not present

## 2024-03-16 DIAGNOSIS — Z419 Encounter for procedure for purposes other than remedying health state, unspecified: Secondary | ICD-10-CM | POA: Diagnosis not present

## 2024-03-23 DIAGNOSIS — D229 Melanocytic nevi, unspecified: Secondary | ICD-10-CM | POA: Diagnosis not present

## 2024-03-23 DIAGNOSIS — C44311 Basal cell carcinoma of skin of nose: Secondary | ICD-10-CM | POA: Diagnosis not present

## 2024-03-23 DIAGNOSIS — D485 Neoplasm of uncertain behavior of skin: Secondary | ICD-10-CM | POA: Diagnosis not present

## 2024-03-23 DIAGNOSIS — D225 Melanocytic nevi of trunk: Secondary | ICD-10-CM | POA: Diagnosis not present

## 2024-03-23 DIAGNOSIS — L821 Other seborrheic keratosis: Secondary | ICD-10-CM | POA: Diagnosis not present

## 2024-03-23 DIAGNOSIS — L905 Scar conditions and fibrosis of skin: Secondary | ICD-10-CM | POA: Diagnosis not present

## 2024-03-23 DIAGNOSIS — L578 Other skin changes due to chronic exposure to nonionizing radiation: Secondary | ICD-10-CM | POA: Diagnosis not present

## 2024-04-16 DIAGNOSIS — Z419 Encounter for procedure for purposes other than remedying health state, unspecified: Secondary | ICD-10-CM | POA: Diagnosis not present

## 2024-04-21 DIAGNOSIS — C44311 Basal cell carcinoma of skin of nose: Secondary | ICD-10-CM | POA: Diagnosis not present

## 2024-05-12 DIAGNOSIS — D239 Other benign neoplasm of skin, unspecified: Secondary | ICD-10-CM | POA: Diagnosis not present

## 2024-05-12 DIAGNOSIS — L905 Scar conditions and fibrosis of skin: Secondary | ICD-10-CM | POA: Diagnosis not present

## 2024-05-16 DIAGNOSIS — Z419 Encounter for procedure for purposes other than remedying health state, unspecified: Secondary | ICD-10-CM | POA: Diagnosis not present

## 2024-06-16 DIAGNOSIS — Z419 Encounter for procedure for purposes other than remedying health state, unspecified: Secondary | ICD-10-CM | POA: Diagnosis not present

## 2024-07-17 DIAGNOSIS — Z419 Encounter for procedure for purposes other than remedying health state, unspecified: Secondary | ICD-10-CM | POA: Diagnosis not present
